# Patient Record
Sex: Female | Born: 1950 | Race: White | Hispanic: No | Marital: Single | Smoking: Never smoker
Health system: Southern US, Community
[De-identification: ages and names within clinical notes are randomized; demographics above are authoritative.]

---

## 2016-04-05 ENCOUNTER — Emergency Department (HOSPITAL_COMMUNITY)
Admission: EM | Admit: 2016-04-05 | Discharge: 2016-04-05 | Disposition: A | Discharge status: Home / Self Care | Payer: Self-pay | Attending: Emergency Medicine | Admitting: Emergency Medicine

## 2016-04-05 ENCOUNTER — Emergency Department (HOSPITAL_COMMUNITY): Payer: Self-pay

## 2016-04-05 ENCOUNTER — Encounter (HOSPITAL_COMMUNITY): Payer: Self-pay | Admitting: *Deleted

## 2016-04-05 DIAGNOSIS — Z79899 Other long term (current) drug therapy: Secondary | ICD-10-CM | POA: Insufficient documentation

## 2016-04-05 DIAGNOSIS — S62101A Fracture of unspecified carpal bone, right wrist, initial encounter for closed fracture: Secondary | ICD-10-CM

## 2016-04-05 DIAGNOSIS — Y9389 Activity, other specified: Secondary | ICD-10-CM | POA: Insufficient documentation

## 2016-04-05 DIAGNOSIS — Y999 Unspecified external cause status: Secondary | ICD-10-CM | POA: Insufficient documentation

## 2016-04-05 DIAGNOSIS — S63502A Unspecified sprain of left wrist, initial encounter: Secondary | ICD-10-CM | POA: Insufficient documentation

## 2016-04-05 DIAGNOSIS — S52601A Unspecified fracture of lower end of right ulna, initial encounter for closed fracture: Secondary | ICD-10-CM | POA: Insufficient documentation

## 2016-04-05 DIAGNOSIS — Y929 Unspecified place or not applicable: Secondary | ICD-10-CM | POA: Insufficient documentation

## 2016-04-05 DIAGNOSIS — S52501A Unspecified fracture of the lower end of right radius, initial encounter for closed fracture: Secondary | ICD-10-CM | POA: Insufficient documentation

## 2016-04-05 MED ORDER — IBUPROFEN 100 MG PO CHEW: 600.0000 mg | CHEWABLE_TABLET | Freq: Three times a day (TID) | ORAL | 0 refills | Status: DC | PRN

## 2016-04-05 MED ORDER — IBUPROFEN 100 MG/5ML PO SUSP
600.0000 mg | Freq: Once | ORAL | Status: AC
  Administered 2016-04-05: 600 mg via ORAL
  Filled 2016-04-05: qty 30

## 2016-04-05 NOTE — ED Notes (Signed)
Pt refused ice pack, pt refused to have staff splint wrist,

## 2016-04-05 NOTE — ED Triage Notes (Signed)
Pt states that she slipped while getting out of her car on the mud, c/o pain to bilateral wrist area, worse on right than left,

## 2016-04-05 NOTE — Discharge Instructions (Signed)
Elevate and apply ice packs on and off to wrist. Call Dr. Mort SawyersHarrison's office tomorrow to arrange a follow-up appointment.

## 2016-04-05 NOTE — ED Provider Notes (Signed)
[facility]Y DEPT Provider Note   CSN: [id number] Arrival date & time: 04/05/16  2043     History   Chief Complaint Chief Complaint  Patient presents with  . Fall    HPI Brooke Barton is a 66 y.o. female.  HPI   Brooke Barton is a 66 y.o. female who presents to the Emergency Department complaining of bilateral wrist pain secondary to a mechanical fall that occurred several hours prior to arrival.  She describes a fall after slipping in some mud, landing on her hands.  She describes throbbing pain and swelling to both wrists with pain to right greater than left.  She denies other injuries including neck or back pain, head injury and LOC.  Pain worse with movement of the right wrist.     History reviewed. No pertinent past medical history.  There are no active problems to display for this patient.   History reviewed. No pertinent surgical history.  OB History    No data available       Home Medications    Prior to Admission medications   Medication Sig Start Date End Date Taking? Authorizing Provider  Acetaminophen (PAIN RELIEVER PO) Take 1 tablet by mouth daily as needed (FOR PAIN (CRUSHED)).   Yes Historical Provider, MD    Family History No family history on file.  Social History Social History  Substance Use Topics  . Smoking status: Never Smoker  . Smokeless tobacco: Never Used  . Alcohol use No     Allergies   Codeine   Review of Systems Review of Systems  Constitutional: Negative for chills and fever.  Eyes: Negative for visual disturbance.  Respiratory: Negative for shortness of breath.   Cardiovascular: Negative for chest pain.  Musculoskeletal: Positive for arthralgias and joint swelling.  Skin: Negative for color change and wound.  All other systems reviewed and are negative.    Physical Exam Updated Vital Signs BP 151/68 (BP Location: Left Arm)   Pulse 71   Temp 97.5 F (36.4 C) (Oral)   Resp 16   Ht 5\' 3"  (1.6 m)   SpO2  100%   Physical Exam  Constitutional: She is oriented to person, place, and time. She appears well-developed and well-nourished. No distress.  HENT:  Head: Normocephalic and atraumatic.  Mouth/Throat: Oropharynx is clear and moist.  Neck: Normal range of motion.  Cardiovascular: Normal rate, regular rhythm and normal heart sounds.   Pulmonary/Chest: Effort normal and breath sounds normal. She exhibits no tenderness.  Musculoskeletal: She exhibits tenderness and deformity. She exhibits no edema.  Diffuse tenderness and Bony deformity of the distal right wrist. No open fracture.  Radial pulse is brisk, distal sensation intact.  CR< 2 sec.  no tenderness proximal to the wrist.  Mild edema and tenderness of the radial aspect of the left wrist as well. No bony deformity, distal sensation intact  Neurological: She is alert and oriented to person, place, and time. She exhibits normal muscle tone. Coordination normal.  Skin: Skin is warm and dry.  Nursing note and vitals reviewed.    ED Treatments / Results  Labs (all labs ordered are listed, but only abnormal results are displayed) Labs Reviewed - No data to display  EKG  EKG Interpretation None       Radiology Dg Wrist Complete Left  Result Date: 04/05/2016 CLINICAL DATA:  66 year old female with bilateral wrist pain. EXAM: LEFT WRIST - COMPLETE 3+ VIEW COMPARISON:  Right wrist radiograph dated 04/05/2016. FINDINGS: There is no  acute fracture or dislocation. The bones are osteopenic. No significant arthritic changes. The soft tissues appear unremarkable. No radiopaque foreign object identified. IMPRESSION: No acute findings. Electronically Signed   By: Elgie Collard M.D.   On: 04/05/2016 22:31   Dg Wrist Complete Right  Result Date: 04/05/2016 CLINICAL DATA:  Radial side left and right wrist pain. Pt stated rt side radiates into forearm. Pt tripped and fell catching herself with both hands. Initial encounter. Pt unable to flex  fingers to the side for navicular view. EXAM: RIGHT WRIST - COMPLETE 3+ VIEW COMPARISON:  None. FINDINGS: Impaction fracture of the distal radius at the metaphysis. Mild dorsal angulation of the distal fracture fragment. The radiocarpal joint is intact. Fracture likely enters the articular surface. Nondisplaced fracture of the distal ulna metaphysis additionally. The ulnar styloid has previously been fractured. IMPRESSION: Acute Fractures of the distal radius and ulna. Electronically Signed   By: Genevive Bi M.D.   On: 04/05/2016 22:32    Procedures Procedures (including critical care time)  Medications Ordered in ED Medications  ibuprofen (ADVIL,MOTRIN) 100 MG/5ML suspension 600 mg (not administered)     Initial Impression / Assessment and Plan / ED Course  I have reviewed the triage vital signs and the nursing notes.  Pertinent labs & imaging results that were available during my care of the patient were reviewed by me and considered in my medical decision making (see chart for details).     Discussed x-ray findings with the patient. X-ray films also reviewed by Dr. Adriana Simas and treatment plan discussed.  Sugar tong splint applied to the right wrist, sling also applied. Remains neurovascularly intact.  Velcro wrist splint applied on the left  Patient declined pain medication stating that she will take a prescription for ibuprofen, but does not want any narcotic pain medication. She agrees to orthopedic follow-up and referral information given for Dr. Romeo Apple.  Final Clinical Impressions(s) / ED Diagnoses   Final diagnoses:  Closed fracture of right wrist, initial encounter  Sprain of left wrist, initial encounter    New Prescriptions New Prescriptions   No medications on file     Pauline Aus, Cordelia Poche 04/07/16 1226    Donnetta Hutching, MD 04/08/16 1128

## 2016-04-08 ENCOUNTER — Encounter: Payer: Self-pay | Admitting: Orthopedic Surgery

## 2016-04-08 ENCOUNTER — Ambulatory Visit (INDEPENDENT_AMBULATORY_CARE_PROVIDER_SITE_OTHER): Payer: Self-pay | Admitting: Orthopedic Surgery

## 2016-04-08 VITALS — BP 160/88 | HR 70 | Wt 143.0 lb

## 2016-04-08 DIAGNOSIS — S52531A Colles' fracture of right radius, initial encounter for closed fracture: Secondary | ICD-10-CM

## 2016-04-08 MED ORDER — IBUPROFEN 40 MG/ML PO SUSP: 10.0000 mL | ORAL | 5 refills | Status: AC | PRN

## 2016-04-08 NOTE — Progress Notes (Signed)
Patient ID: Brooke Barton, female   DOB: 04-05-1950, 66 y.o.   MRN: [id number]  Chief complaint is pain right wrist 1 day   HPI Brooke Sportsmanatsy Kroeker is a 66 y.o. female.   66 year old female landed on both wrists after falling slipping in the modified  She has pain over the right wrist it is dull and throbbing it is moderately severe it has been controlled with Motrin liquid. Duration 1 day timing constant worse with movement    Review of Systems Review of Systems  Constitutional: Negative for fever.  Respiratory: Negative for shortness of breath.   Cardiovascular: Negative for chest pain.  Neurological: Negative for numbness.   She has no medical history of hypertension diabetes   Social History Social History  Substance Use Topics  . Smoking status: Never Smoker  . Smokeless tobacco: Never Used  . Alcohol use No    Allergies  Allergen Reactions  . Codeine     'running into walls"     Current Outpatient Prescriptions  Medication Sig Dispense Refill  . Acetaminophen (PAIN RELIEVER PO) Take 1 tablet by mouth daily as needed (FOR PAIN (CRUSHED)).    . ibuprofen (ADVIL,MOTRIN) 100 MG chewable tablet Chew 6 tablets (600 mg total) by mouth every 8 (eight) hours as needed. 60 tablet 0   No current facility-administered medications for this visit.      Physical Exam Blood pressure (!) 160/88, pulse 70, weight 143 lb (64.9 kg). Physical Exam Ambulatory status NORMAL  RIGHT WRIST / HAND  Inspection SWELLING AND TENDERNESS OVER THE FRACTURE SITE   Range of motion LIMITED TO 10 DEGREES BY PAIN   Stability tests COULD NOT PERFORM   Motor exam NORMAL TONE    Neurovascular examination is intact  Lymph node palpation is normal  The opposite extremity exhibits normal range of motion stability and strength neurovascular exam is intact, lymph nodes are negative and there is no swelling or tenderness   Data Reviewed  independent image interpretation :  3 views of the  right wrist show a dorsally displaced and angulated intra-articular fracture of the distal radius with loss of volar tilt mild shortening and a split probably in the scaphoid or lunate fossa the dorsal fragment displaced  3 views left wrist normal  Assessment    66 year old right-hand-dominant female displaced intra-articular distal radius fracture, patient declined surgery    Plan    CONSENT ELEMENTS  1. Clinical issues (DX) displaced distal radius fracture 2. Options discussed include surgery versus non-surgery 3. Pros and cons improved alignment improved wrist function improved hand function 4. Uncertainty with outcomes none 5. Assess px understanding demonstrated understanding 6. Discern/assess patient preference patient prefers nonoperative treatment  Application short arm cast come back in 3 weeks for x-ray out of cast   Fuller CanadaStanley Kalen Ratajczak, MD 04/08/2016 10:00 AM

## 2016-05-01 ENCOUNTER — Encounter: Payer: Self-pay | Admitting: Orthopedic Surgery

## 2016-05-01 ENCOUNTER — Ambulatory Visit (INDEPENDENT_AMBULATORY_CARE_PROVIDER_SITE_OTHER): Payer: Self-pay | Admitting: Orthopedic Surgery

## 2016-05-01 ENCOUNTER — Ambulatory Visit (INDEPENDENT_AMBULATORY_CARE_PROVIDER_SITE_OTHER): Payer: Self-pay

## 2016-05-01 DIAGNOSIS — S52531D Colles' fracture of right radius, subsequent encounter for closed fracture with routine healing: Secondary | ICD-10-CM

## 2016-05-01 NOTE — Progress Notes (Signed)
Patient ID: Brooke Barton, female   DOB: [birth year], 66 y.o.   MRN: [id number]  Follow up visit/  fracture care  Chief Complaint  Patient presents with  . Follow-up    fracture right radius, DOI 04/05/16; With left wrist sprain     66 year old female declined surgery on her right wrist show she was placed in a cast and she's here today for 3 weeks x-ray out of plaster   She is still tender over the right wrist ulnar fracture and mildly over the radial fracture there is mild subtle deformity of the wrist  She still having pain over the left wrist joint, soreness: Which was treated with splinting  Today the x-ray of the right wrist showsSlightly angulated distal radius fracture with settling and ulnar positive variance  Encounter Diagnosis  Name Primary?  . Closed Colles' fracture of right radius with routine healing, subsequent encounter Yes    Plan convert to a right wrist splint, will need another 6 or 8 weeks of treatment  Come back for x-ray  No work thru 06/01/16  9:16 AM Fuller CanadaStanley Harrison, MD 05/01/2016

## 2016-06-01 ENCOUNTER — Encounter: Payer: Self-pay | Admitting: Orthopedic Surgery

## 2016-06-01 ENCOUNTER — Telehealth: Payer: Self-pay | Admitting: Orthopedic Surgery

## 2016-06-01 ENCOUNTER — Ambulatory Visit (INDEPENDENT_AMBULATORY_CARE_PROVIDER_SITE_OTHER): Payer: Self-pay | Admitting: Orthopedic Surgery

## 2016-06-01 ENCOUNTER — Ambulatory Visit (INDEPENDENT_AMBULATORY_CARE_PROVIDER_SITE_OTHER): Payer: Self-pay

## 2016-06-01 DIAGNOSIS — S52531D Colles' fracture of right radius, subsequent encounter for closed fracture with routine healing: Secondary | ICD-10-CM

## 2016-06-01 NOTE — Telephone Encounter (Signed)
[place] [facility] [place]  Soon after, they called and requested that we change our order from OT to PT.  She said their hand specialist was out of medical leave.

## 2016-06-01 NOTE — Patient Instructions (Signed)
oow x 1 month   Start OT

## 2016-06-01 NOTE — Addendum Note (Signed)
Addended by: Adella HareBOOTHE, JAIME B on: 06/01/2016 04:31 PM   Modules accepted: Orders

## 2016-06-01 NOTE — Progress Notes (Signed)
FOLLOW UP VISIT   Patient ID: Brooke Barton, female   DOB: Nov 13, 1950, [age] y.o.   MRN: [id number]  Chief Complaint  Patient presents with  . Follow-up    Rt colles fracture, DOI 04/05/16    HPI Brooke Barton is a 66 y.o. female.   HPI  66 year old female opted for nonoperative treatment of a left distal radius fracture. This is week #8 comes in for x-ray  Review of Systems Review of Systems     Physical Exam  Obvious deformity from the shortening and ulnar positive variance is noted. She has tenderness at the distal radius and she has very stiff fingers   MEDICAL DECISION MAKING  DATA   Multiple views of the right distal radius fracture  Shortening loss of radial inclination less than 10 dorsal tilt on x-ray  Encounter Diagnosis  Name Primary?  . Closed Colles' fracture of right radius with routine healing, subsequent encounter Yes      PLAN(RISK)    Recommend occupational therapy  Out of work for one month

## 2016-06-29 ENCOUNTER — Ambulatory Visit: Payer: Self-pay | Admitting: Orthopedic Surgery

## 2018-02-25 IMAGING — DX DG WRIST COMPLETE 3+V*R*
4 series · 4 of 4 positions shown · non-contrast
Comparison: None.

CLINICAL DATA: Radial side left and right wrist pain. Pt stated rt
side radiates into forearm. Pt tripped and fell catching herself
with both hands. Initial encounter. Pt unable to flex fingers to the
side for navicular view.

EXAM:
RIGHT WRIST - COMPLETE 3+ VIEW

[wrist pa]
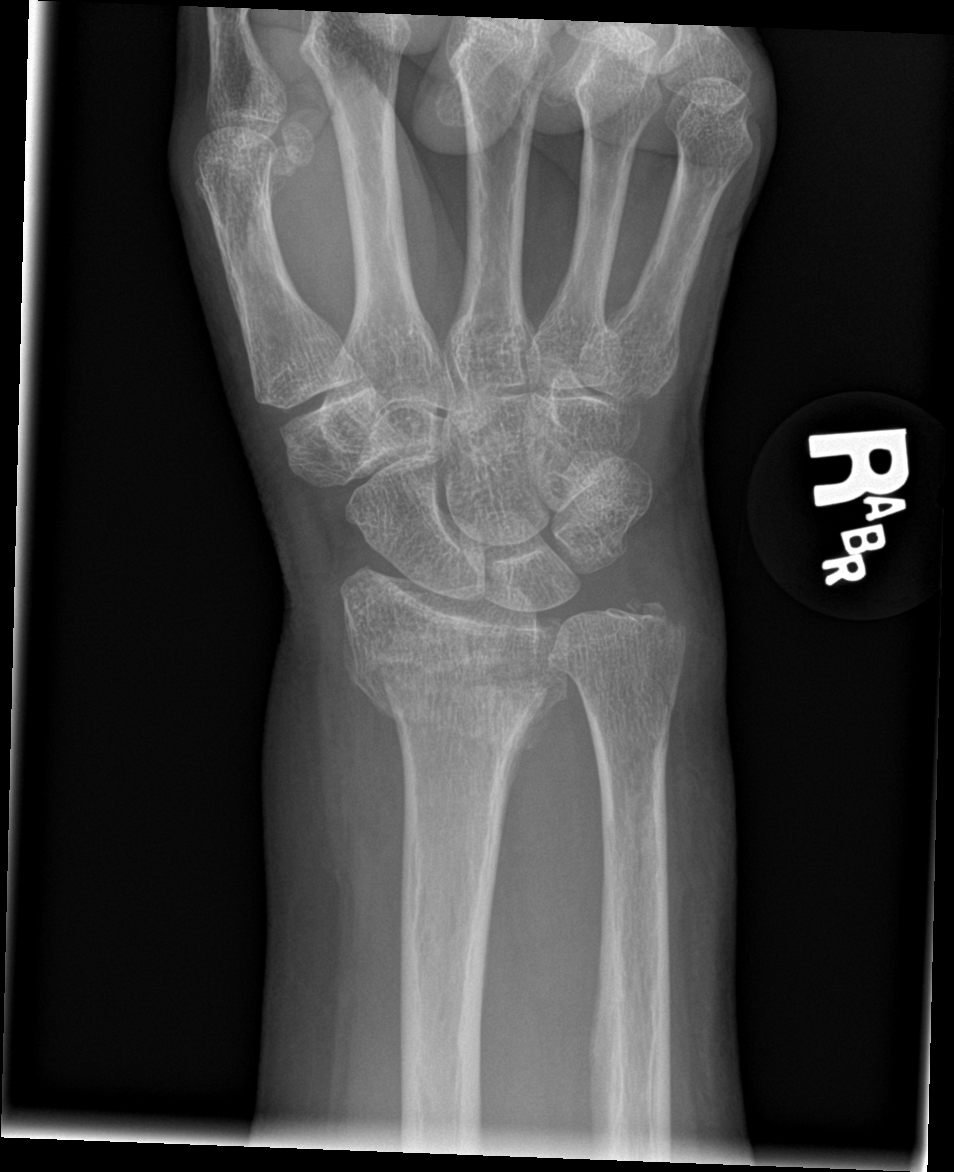

[wrist obl]
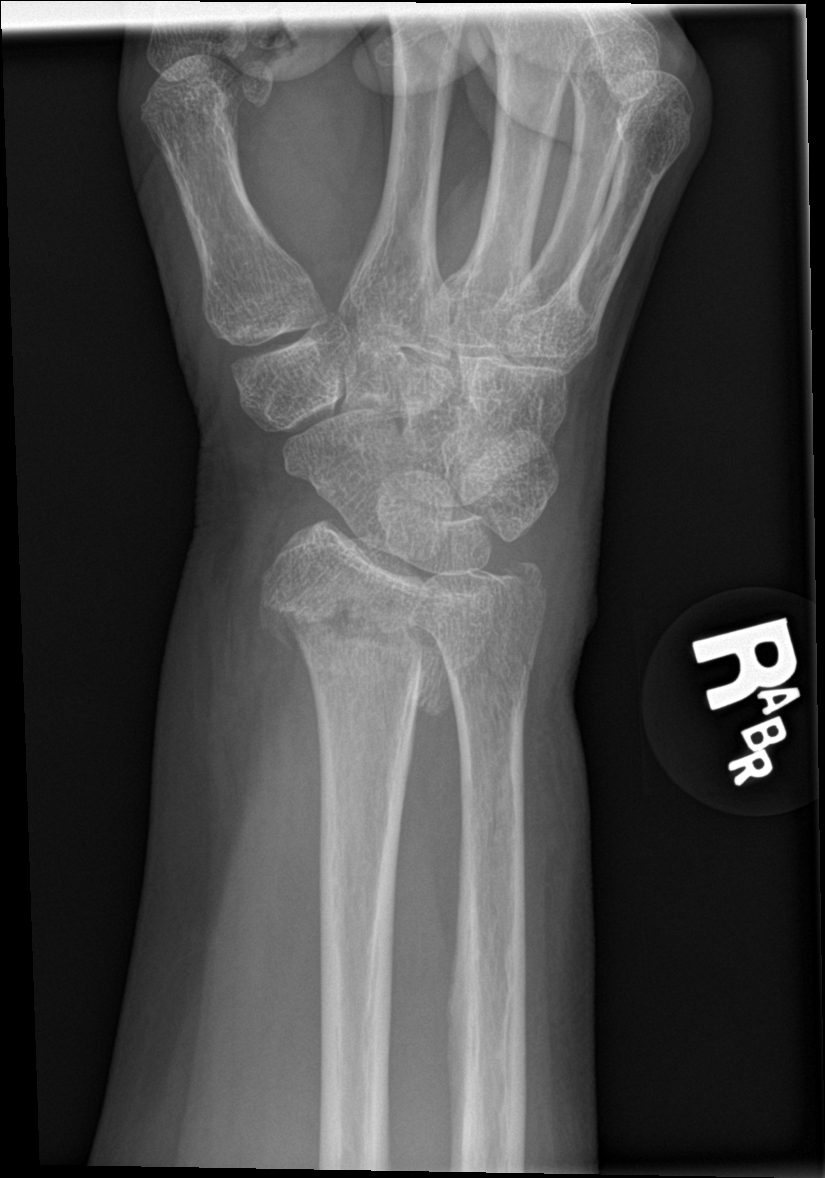

[wrist navicular]
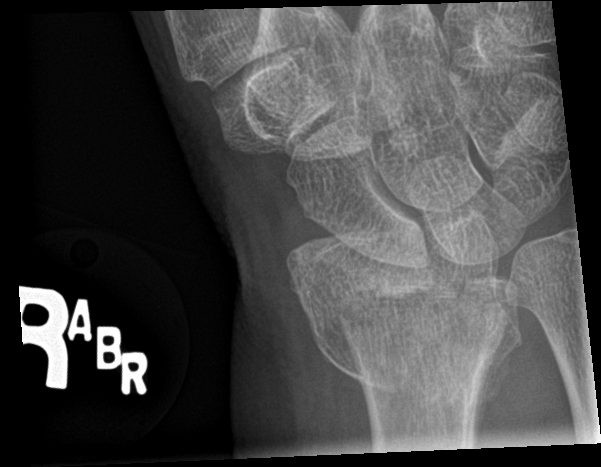

[wrist lat]
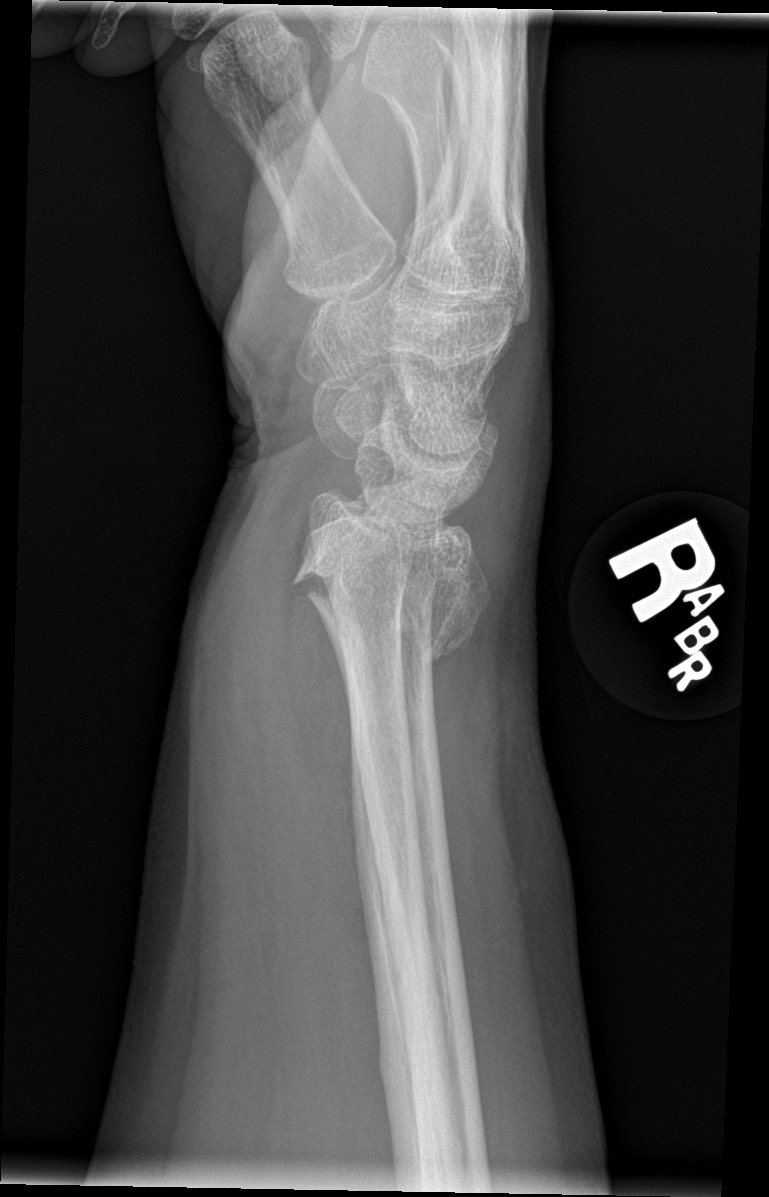

[4 of 4 positions shown; findings below may reference images not displayed]

FINDINGS: Impaction fracture of the distal radius at the metaphysis. Mild
dorsal angulation of the distal fracture fragment. The radiocarpal
joint is intact. Fracture likely enters the articular surface.

Nondisplaced fracture of the distal ulna metaphysis additionally.
The ulnar styloid has previously been fractured.
IMPRESSION: Acute Fractures of the distal radius and ulna.
